# Patient Record
Sex: Female | Born: 1965 | Race: White | Hispanic: No | Marital: Married | Smoking: Current every day smoker
Health system: Southern US, Community
[De-identification: ages and names within clinical notes are randomized; demographics above are authoritative.]

## PROBLEM LIST (undated history)

## (undated) DIAGNOSIS — M069 Rheumatoid arthritis, unspecified: Secondary | ICD-10-CM

## (undated) HISTORY — PX: OOPHORECTOMY: SHX86

---

## 2019-03-31 ENCOUNTER — Emergency Department (HOSPITAL_BASED_OUTPATIENT_CLINIC_OR_DEPARTMENT_OTHER): Payer: BLUE CROSS/BLUE SHIELD

## 2019-03-31 ENCOUNTER — Other Ambulatory Visit: Payer: Self-pay

## 2019-03-31 ENCOUNTER — Emergency Department (HOSPITAL_BASED_OUTPATIENT_CLINIC_OR_DEPARTMENT_OTHER)
Admission: EM | Admit: 2019-03-31 | Discharge: 2019-03-31 | Disposition: A | Discharge status: Home / Self Care | Payer: BLUE CROSS/BLUE SHIELD | Attending: Emergency Medicine | Admitting: Emergency Medicine

## 2019-03-31 ENCOUNTER — Encounter (HOSPITAL_BASED_OUTPATIENT_CLINIC_OR_DEPARTMENT_OTHER): Payer: Self-pay | Admitting: Emergency Medicine

## 2019-03-31 DIAGNOSIS — R1013 Epigastric pain: Secondary | ICD-10-CM | POA: Diagnosis not present

## 2019-03-31 DIAGNOSIS — F17210 Nicotine dependence, cigarettes, uncomplicated: Secondary | ICD-10-CM | POA: Insufficient documentation

## 2019-03-31 DIAGNOSIS — R110 Nausea: Secondary | ICD-10-CM | POA: Diagnosis not present

## 2019-03-31 DIAGNOSIS — I10 Essential (primary) hypertension: Secondary | ICD-10-CM | POA: Diagnosis not present

## 2019-03-31 DIAGNOSIS — R079 Chest pain, unspecified: Secondary | ICD-10-CM | POA: Diagnosis present

## 2019-03-31 DIAGNOSIS — R42 Dizziness and giddiness: Secondary | ICD-10-CM | POA: Diagnosis not present

## 2019-03-31 DIAGNOSIS — Z79899 Other long term (current) drug therapy: Secondary | ICD-10-CM | POA: Diagnosis not present

## 2019-03-31 DIAGNOSIS — R072 Precordial pain: Secondary | ICD-10-CM | POA: Insufficient documentation

## 2019-03-31 DIAGNOSIS — R232 Flushing: Secondary | ICD-10-CM | POA: Diagnosis not present

## 2019-03-31 HISTORY — DX: Rheumatoid arthritis, unspecified: M06.9

## 2019-03-31 LAB — CBC WITH DIFFERENTIAL/PLATELET
Abs Immature Granulocytes: 0.06 K/uL (ref 0.00–0.07)
Basophils Absolute: 0.1 K/uL (ref 0.0–0.1)
Basophils Relative: 1 %
Eosinophils Absolute: 0.3 K/uL (ref 0.0–0.5)
Eosinophils Relative: 2 %
HCT: 41.7 % (ref 36.0–46.0)
Hemoglobin: 13.6 g/dL (ref 12.0–15.0)
Immature Granulocytes: 1 %
Lymphocytes Relative: 32 %
Lymphs Abs: 3.9 K/uL (ref 0.7–4.0)
MCH: 28.9 pg (ref 26.0–34.0)
MCHC: 32.6 g/dL (ref 30.0–36.0)
MCV: 88.5 fL (ref 80.0–100.0)
Monocytes Absolute: 1 K/uL (ref 0.1–1.0)
Monocytes Relative: 8 %
Neutro Abs: 6.9 K/uL (ref 1.7–7.7)
Neutrophils Relative %: 56 %
Platelets: 237 K/uL (ref 150–400)
RBC: 4.71 MIL/uL (ref 3.87–5.11)
RDW: 12.9 % (ref 11.5–15.5)
WBC: 12.1 K/uL — ABNORMAL HIGH (ref 4.0–10.5)
nRBC: 0 % (ref 0.0–0.2)

## 2019-03-31 LAB — BASIC METABOLIC PANEL
Anion gap: 11 (ref 5–15)
BUN: 16 mg/dL (ref 6–20)
CO2: 21 mmol/L — ABNORMAL LOW (ref 22–32)
Calcium: 9.9 mg/dL (ref 8.9–10.3)
Chloride: 109 mmol/L (ref 98–111)
Creatinine, Ser: 0.99 mg/dL (ref 0.44–1.00)
GFR calc Af Amer: >60 mL/min (ref >60)
GFR calc non Af Amer: >60 mL/min (ref >60)
Glucose, Bld: 116 mg/dL — ABNORMAL HIGH (ref 70–99)
Potassium: 4 mmol/L (ref 3.5–5.1)
Sodium: 141 mmol/L (ref 135–145)

## 2019-03-31 LAB — TROPONIN I
Troponin I: <0.03 ng/mL (ref <0.03)
Troponin I: <0.03 ng/mL (ref <0.03)

## 2019-03-31 MED ORDER — IOHEXOL 350 MG/ML SOLN
100.0000 mL | Freq: Once | INTRAVENOUS | Status: AC | PRN
  Administered 2019-03-31: 100 mL via INTRAVENOUS

## 2019-03-31 MED ORDER — ALUM & MAG HYDROXIDE-SIMETH 200-200-20 MG/5ML PO SUSP
ORAL | Status: AC
  Filled 2019-03-31: qty 30

## 2019-03-31 MED ORDER — LIDOCAINE VISCOUS HCL 2 % MT SOLN
OROMUCOSAL | Status: AC
  Filled 2019-03-31: qty 15

## 2019-03-31 MED ORDER — ALUM & MAG HYDROXIDE-SIMETH 200-200-20 MG/5ML PO SUSP
30.0000 mL | Freq: Once | ORAL | Status: AC
  Administered 2019-03-31: 30 mL via ORAL
  Filled 2019-03-31: qty 30

## 2019-03-31 MED ORDER — ALUM & MAG HYDROXIDE-SIMETH 200-200-20 MG/5ML PO SUSP
30.0000 mL | Freq: Once | ORAL | Status: AC
  Administered 2019-03-31: 30 mL via ORAL

## 2019-03-31 MED ORDER — OMEPRAZOLE 20 MG PO CPDR: 20.0000 mg | DELAYED_RELEASE_CAPSULE | Freq: Every day | ORAL | 0 refills | Status: AC

## 2019-03-31 MED ORDER — LIDOCAINE VISCOUS HCL 2 % MT SOLN
15.0000 mL | Freq: Once | OROMUCOSAL | Status: AC
  Administered 2019-03-31: 06:00:00 15 mL via ORAL
  Filled 2019-03-31: qty 15

## 2019-03-31 MED ORDER — LIDOCAINE VISCOUS HCL 2 % MT SOLN
15.0000 mL | Freq: Once | OROMUCOSAL | Status: AC
  Administered 2019-03-31: 15 mL via ORAL

## 2019-03-31 MED ORDER — KETOROLAC TROMETHAMINE 30 MG/ML IJ SOLN
15.0000 mg | Freq: Once | INTRAMUSCULAR | Status: AC
  Administered 2019-03-31: 15 mg via INTRAVENOUS
  Filled 2019-03-31: qty 1

## 2019-03-31 NOTE — ED Provider Notes (Signed)
Patient second troponin is negative.  Epigastric/atypical chest pain seems to be more GI in nature.  CTA was negative for dissection, aneurysms or other acute findings.  On CTA the gallbladder appeared unremarkable.  Patient is hemodynamically stable and prescribed omeprazole for concern for GERD or GI component to her pain.  She was given return precautions if symptoms worsen or she starts vomiting.  At that time she may need a more focused evaluation of the gallbladder.   Blanchie Dessert, MD 03/31/19 [phone]

## 2019-03-31 NOTE — ED Notes (Signed)
ED Provider at bedside. 

## 2019-03-31 NOTE — ED Notes (Signed)
Patient is resting comfortably. 

## 2019-03-31 NOTE — ED Notes (Signed)
Up to BR, gait steady 

## 2019-03-31 NOTE — ED Triage Notes (Signed)
Reports CP that started at 0300. Also reports nausea and back pain. Reports intermittent dizziness over the last week.

## 2019-03-31 NOTE — Discharge Instructions (Signed)
Take the Prilosec daily for at least 2 weeks.  Take approximately 5 to 7 days for the medication to truly kick in and noticed effective.  You can use Tums, Maalox, Tylenol as needed for the pain in the meantime.  Try to stick to the diet restrictions.  If you develop worsening pain, vomiting, fever or other concerns please follow-up with your doctor or return to the emergency room

## 2019-03-31 NOTE — ED Notes (Signed)
ED MD informed of epigastric pain

## 2019-03-31 NOTE — ED Provider Notes (Signed)
[facility] EMERGENCY DEPARTMENT Provider Note   CSN: [id number] Arrival date & time: 03/31/19  0518     History   Chief Complaint Chief Complaint  Patient presents with  . Chest Pain    HPI Stephanie Waller is a 53 y.o. female.     The history is provided by the patient.  Chest Pain Pain location:  Substernal area Pain quality: dull   Pain quality comment:  Indigestion Pain radiates to:  Upper back Pain severity:  Severe Onset quality:  Sudden Duration:  3 hours Timing:  Constant Progression:  Unchanged Chronicity:  New Context: at rest   Context: not breathing   Relieved by:  Nothing Worsened by:  Nothing Ineffective treatments:  Antacids Associated symptoms: dizziness and nausea   Associated symptoms: no abdominal pain, no cough, no fever, no headache, no lower extremity edema, no near-syncope, no palpitations and no shortness of breath   Associated symptoms comment:  Intermittent dizziness x 1 week.   Risk factors: hypertension and obesity   Patient with HTN ate a filet and some squash sauteed and then woke up with chest pain and epigastric pain.  She has had "hot flashes" off and on for the past week.    Past Medical History:  Diagnosis Date  . Rheumatoid arthritis ([place])     There are no active problems to display for this patient.   Past Surgical History:  Procedure Laterality Date  . OOPHORECTOMY       OB History   No obstetric history on file.      Home Medications    Prior to Admission medications   Medication Sig Start Date End Date Taking? Authorizing Provider  Adalimumab (HUMIRA) 20 MG/0.2ML PSKT Inject into the skin.   Yes [provider]  cloNIDine (CATAPRES) 0.1 MG tablet Take 0.1 mg by mouth 2 (two) times daily.   Yes [provider]  cyclobenzaprine (FLEXERIL) 10 MG tablet Take 10 mg by mouth 3 (three) times daily as needed for muscle spasms.   Yes [provider]  escitalopram (LEXAPRO) 10 MG tablet  Take 10 mg by mouth daily.   Yes [provider]  gabapentin (NEURONTIN) 100 MG capsule Take 100 mg by mouth 3 (three) times daily.   Yes [provider]  hydroxychloroquine (PLAQUENIL) 200 MG tablet Take by mouth daily.   Yes [provider]    Family History No family history on file.  Social History Social History   Tobacco Use  . Smoking status: Current Every Day Smoker    Types: Cigarettes  Substance Use Topics  . Alcohol use: Yes    Comment: occasional  . Drug use: Never     Allergies   Penicillins   Review of Systems Review of Systems  Constitutional: Negative for fever.  Respiratory: Negative for cough and shortness of breath.   Cardiovascular: Positive for chest pain. Negative for palpitations and near-syncope.  Gastrointestinal: Positive for nausea. Negative for abdominal pain.  Neurological: Positive for dizziness. Negative for headaches.  All other systems reviewed and are negative.    Physical Exam Updated Vital Signs BP (!) 154/82 (BP Location: Right Arm)   Pulse 74   Temp 98.1 F (36.7 C) (Oral)   Resp 20   Ht 5\' 4"  (1.626 m)   Wt 104.3 kg   SpO2 98%   BMI 39.48 kg/m   Physical Exam Vitals signs and nursing note reviewed.  Constitutional:      Appearance: Normal appearance.  HENT:     Head: Normocephalic.     Nose: Nose normal. No congestion.  Eyes:     Conjunctiva/sclera: Conjunctivae normal.     Pupils: Pupils are equal, round, and reactive to light.  Neck:     Musculoskeletal: Normal range of motion and neck supple.  Cardiovascular:     Rate and Rhythm: Normal rate and regular rhythm.     Pulses: Normal pulses.     Heart sounds: Normal heart sounds.  Pulmonary:     Effort: Pulmonary effort is normal.     Breath sounds: Normal breath sounds.  Abdominal:     General: Abdomen is flat. Bowel sounds are normal.     Tenderness: There is no abdominal tenderness. There is no guarding.  Musculoskeletal: Normal  range of motion.  Skin:    General: Skin is warm and dry.     Capillary Refill: Capillary refill takes less than 2 seconds.  Neurological:     General: No focal deficit present.     Mental Status: She is alert and oriented to person, place, and time.  Psychiatric:        Mood and Affect: Mood normal.        Behavior: Behavior normal.      ED Treatments / Results  Labs (all labs ordered are listed, but only abnormal results are displayed) Results for orders placed or performed during the hospital encounter of 03/31/19  CBC with Differential/Platelet  Result Value Ref Range   WBC 12.1 (H) 4.0 - 10.5 K/uL   RBC 4.71 3.87 - 5.11 MIL/uL   Hemoglobin 13.6 12.0 - 15.0 g/dL   HCT 16.141.7 09.636.0 - 04.546.0 %   MCV 88.5 80.0 - 100.0 fL   MCH 28.9 26.0 - 34.0 pg   MCHC 32.6 30.0 - 36.0 g/dL   RDW 40.912.9 81.111.5 - 91.415.5 %   Platelets 237 150 - 400 K/uL   nRBC 0.0 0.0 - 0.2 %   Neutrophils Relative % 56 %   Neutro Abs 6.9 1.7 - 7.7 K/uL   Lymphocytes Relative 32 %   Lymphs Abs 3.9 0.7 - 4.0 K/uL   Monocytes Relative 8 %   Monocytes Absolute 1.0 0.1 - 1.0 K/uL   Eosinophils Relative 2 %   Eosinophils Absolute 0.3 0.0 - 0.5 K/uL   Basophils Relative 1 %   Basophils Absolute 0.1 0.0 - 0.1 K/uL   Immature Granulocytes 1 %   Abs Immature Granulocytes 0.06 0.00 - 0.07 K/uL  Basic metabolic panel  Result Value Ref Range   Sodium 141 135 - 145 mmol/L   Potassium 4.0 3.5 - 5.1 mmol/L   Chloride 109 98 - 111 mmol/L   CO2 21 (L) 22 - 32 mmol/L   Glucose, Bld 116 (H) 70 - 99 mg/dL   BUN 16 6 - 20 mg/dL   Creatinine, Ser 7.820.99 0.44 - 1.00 mg/dL   Calcium 9.9 8.9 - 95.610.3 mg/dL   GFR calc non Af Amer >60 >60 mL/min   GFR calc Af Amer >60 >60 mL/min   Anion gap 11 5 - 15  Troponin I - ONCE - STAT  Result Value Ref Range   Troponin I <0.03 <0.03 ng/mL   Dg Chest Portable 1 View  Result Date: 03/31/2019 CLINICAL DATA:  Acute onset of chest pain.  Chronic cough. EXAM: PORTABLE CHEST 1 VIEW COMPARISON:   None. FINDINGS: The heart size and mediastinal contours are within normal limits. Both lungs are clear. The visualized skeletal  structures are unremarkable. IMPRESSION: Negative.  No active disease. Electronically Signed   By: Myles RosenthalJohn  Stahl M.D.   On: 03/31/2019 05:50   EKG EKG Interpretation  Date/Time:  Sunday March 31 2019 05:41:36 EDT Ventricular Rate:  65 PR Interval:    QRS Duration: 103 QT Interval:  387 QTC Calculation: 403 R Axis:   36 Text Interpretation:  Sinus rhythm Confirmed by Savreen Gebhardt ([id number]) on 03/31/2019 6:07:47 AM   Radiology Dg Chest Portable 1 View  Result Date: 03/31/2019 CLINICAL DATA:  Acute onset of chest pain.  Chronic cough. EXAM: PORTABLE CHEST 1 VIEW COMPARISON:  None. FINDINGS: The heart size and mediastinal contours are within normal limits. Both lungs are clear. The visualized skeletal structures are unremarkable. IMPRESSION: Negative.  No active disease. Electronically Signed   By: Myles RosenthalJohn  Stahl M.D.   On: 03/31/2019 05:50    Procedures Procedures (including critical care time)  Medications Ordered in ED Medications  ketorolac (TORADOL) 30 MG/ML injection 15 mg (15 mg Intravenous Given 03/31/19 0552)  alum & mag hydroxide-simeth (MAALOX/MYLANTA) 200-200-20 MG/5ML suspension 30 mL (30 mLs Oral Given 03/31/19 0551)    And  lidocaine (XYLOCAINE) 2 % viscous mouth solution 15 mL (15 mLs Oral Given 03/31/19 0551)       Final Clinical Impressions(s) / ED Diagnoses   Signed out to Dr. Venia MinksPunkett pending second troponin.     Hyman Crossan, MD 03/31/19 0730

## 2020-08-25 DIAGNOSIS — R69 Illness, unspecified: Secondary | ICD-10-CM | POA: Diagnosis not present

## 2020-08-25 DIAGNOSIS — J0100 Acute maxillary sinusitis, unspecified: Secondary | ICD-10-CM | POA: Diagnosis not present

## 2020-08-25 DIAGNOSIS — J302 Other seasonal allergic rhinitis: Secondary | ICD-10-CM | POA: Diagnosis not present

## 2020-09-21 DIAGNOSIS — B373 Candidiasis of vulva and vagina: Secondary | ICD-10-CM | POA: Diagnosis not present

## 2020-11-22 IMAGING — CT CT ANGIO CHEST-ABD-PELV FOR DISSECTION W/ AND WO/W CM
2 of 9 series · 14 of 46 positions shown, 18 images · IV contrast (APPLIED)
Comparison: None.

CLINICAL DATA: Chest pain radiating to back which began 3 hours
ago. Smoker. Rheumatoid arthritis.

EXAM:
CT ANGIOGRAPHY CHEST, ABDOMEN AND PELVIS
TECHNIQUE: Multidetector CT imaging through the chest, abdomen and pelvis was
performed using the standard protocol during bolus administration of
intravenous contrast. Multiplanar reconstructed images and MIPs were
obtained and reviewed to evaluate the vascular anatomy.
CONTRAST:  100mL OMNIPAQUE IOHEXOL 350 MG/ML SOLN

[Series 6: axial arterial · axial · arterial · 0.84mm/px · z∈[-572,-44]mm · 11 of 209 slices shown, 15 images]
[im 22/209  soft-tissue]
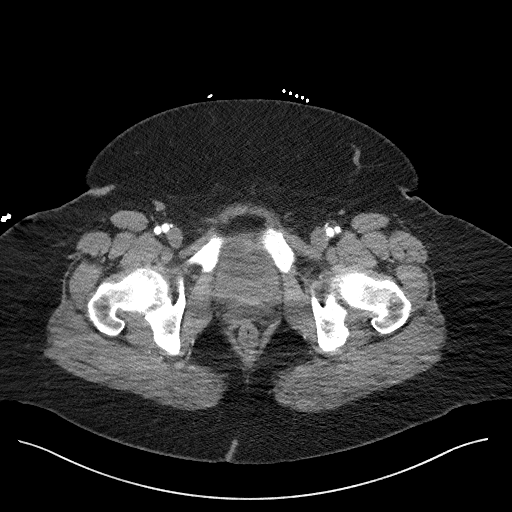
[im 22/209  bone]
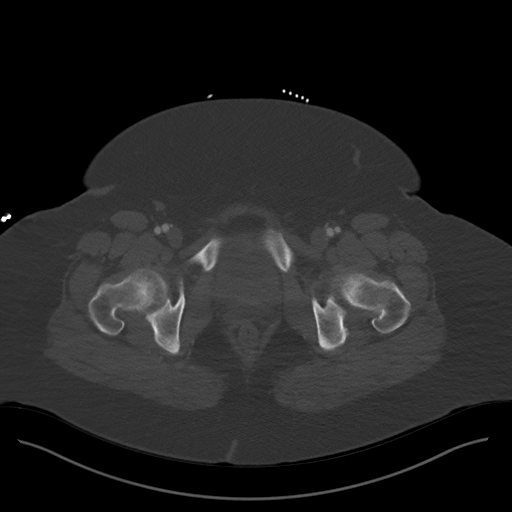
[im 44/209  soft-tissue]
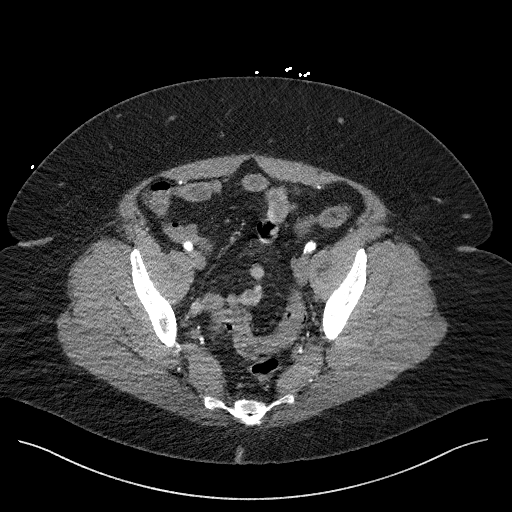
[im 66/209  soft-tissue]
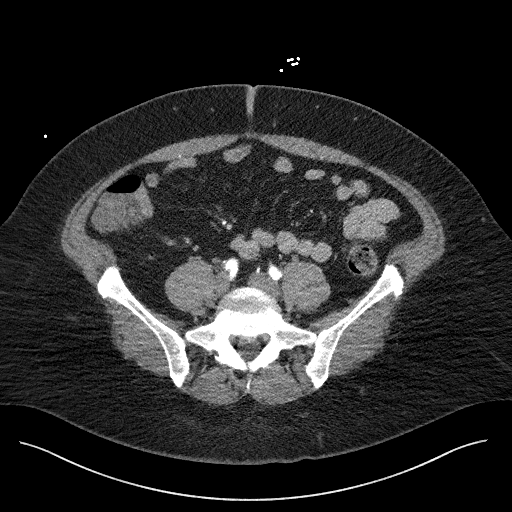
[im 88/209  soft-tissue]
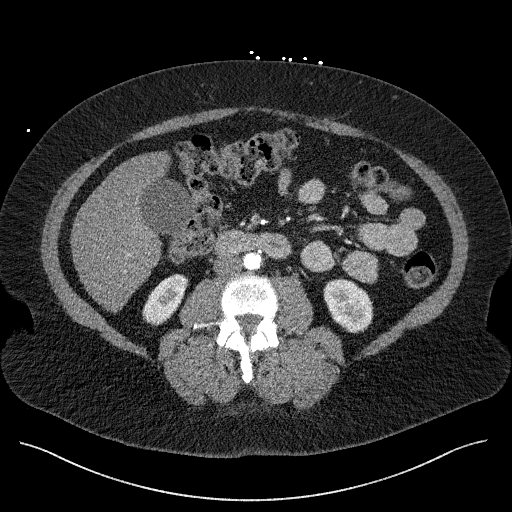
[im 110/209  soft-tissue]
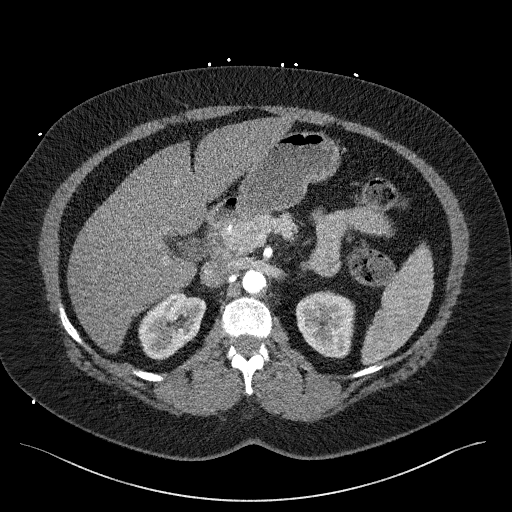
[im 132/209  soft-tissue]
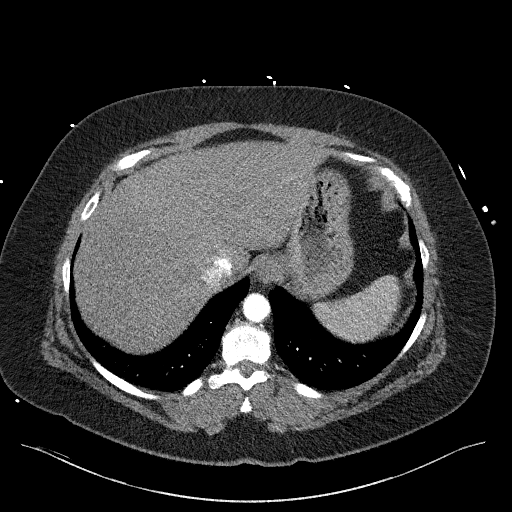
[im 154/209  soft-tissue]
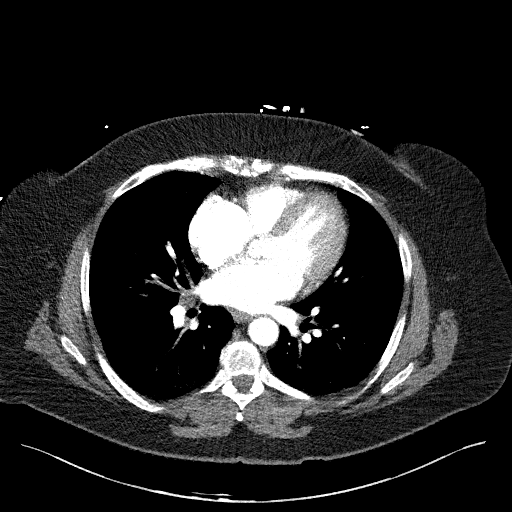
[im 165/209  lung]
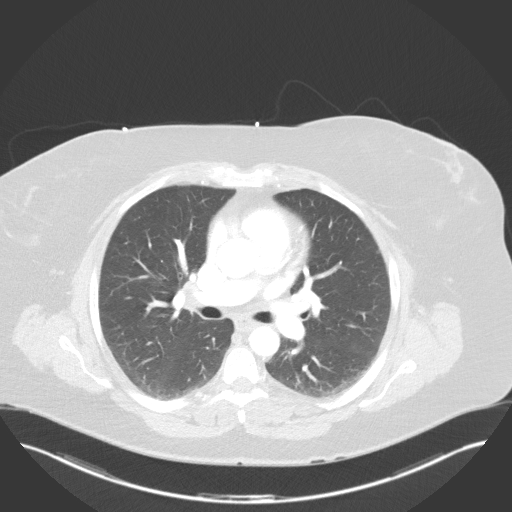
[im 176/209  soft-tissue]
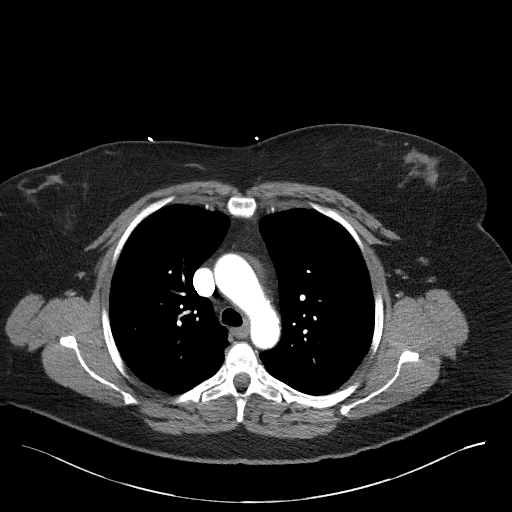
[im 176/209  lung]
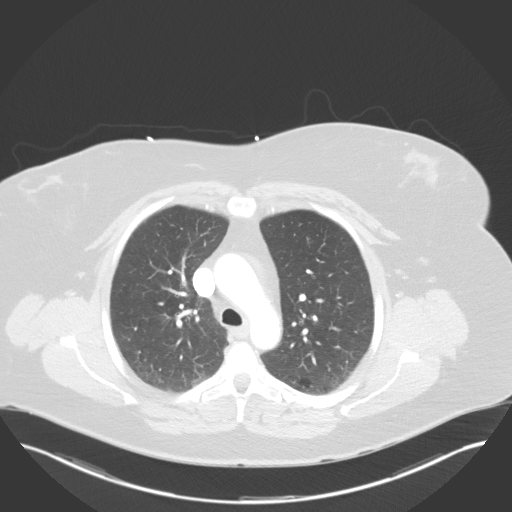
[im 187/209  lung]
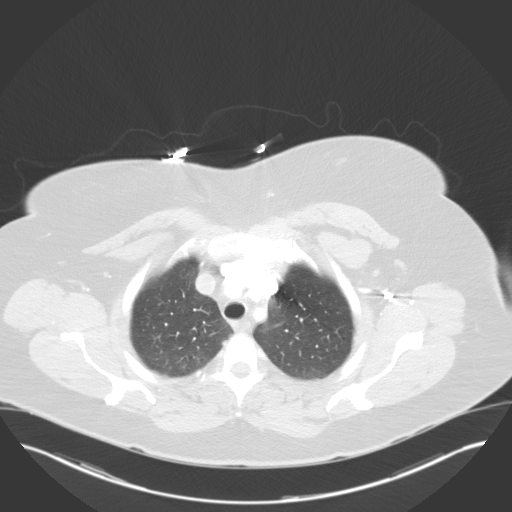
[im 198/209  soft-tissue]
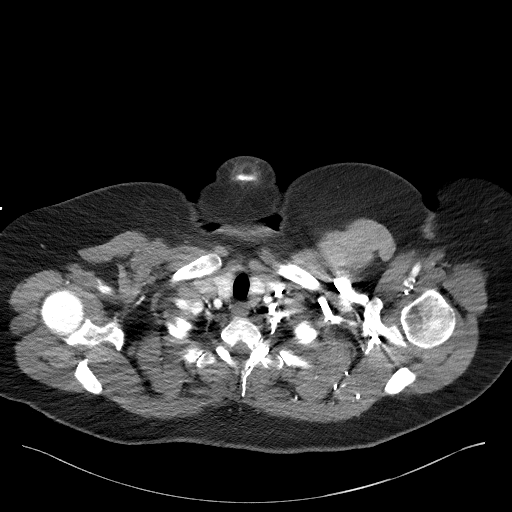
[im 198/209  lung]
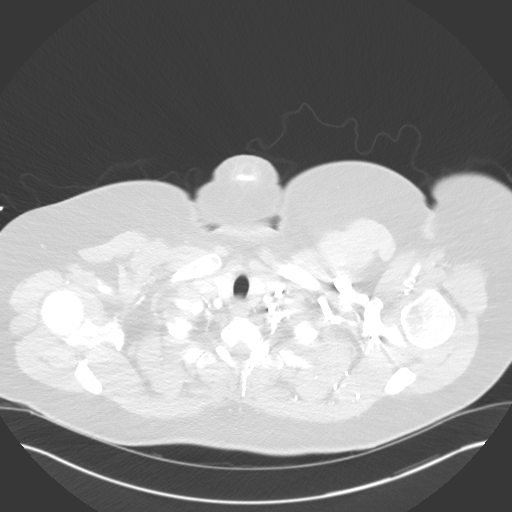
[im 198/209  bone]
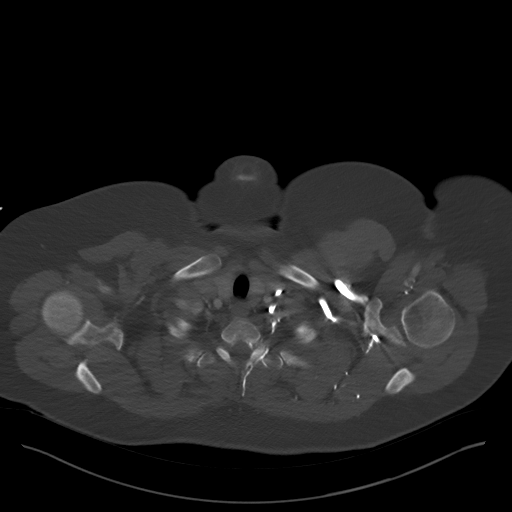

[Series 9: coronals · coronal · 0.99mm/px · 3 of 171 slices shown]
[im 43/171  soft-tissue]
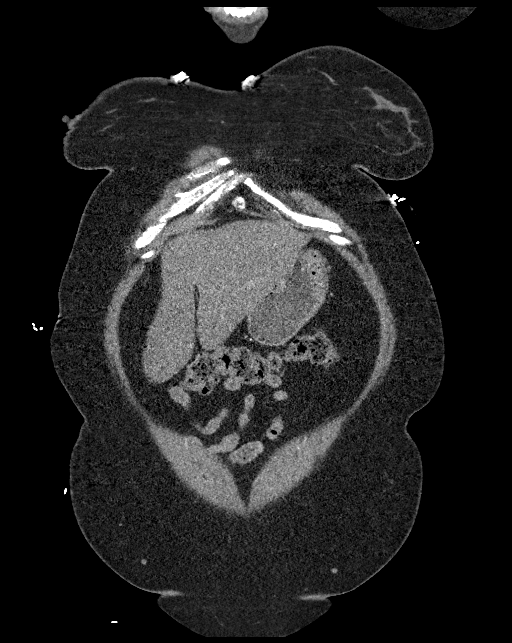
[im 86/171  soft-tissue]
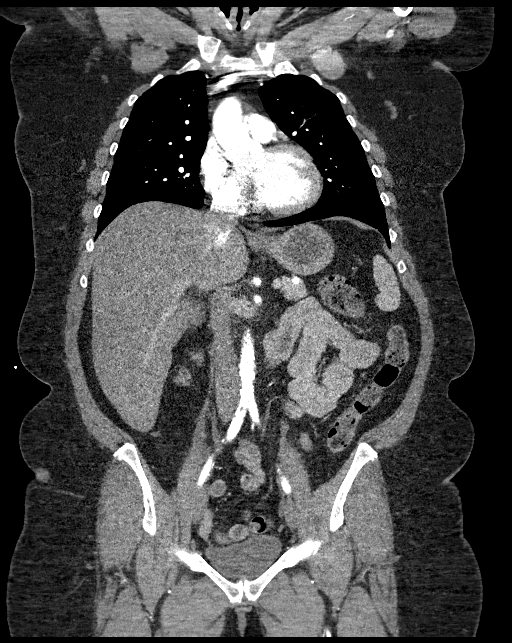
[im 128/171  soft-tissue]
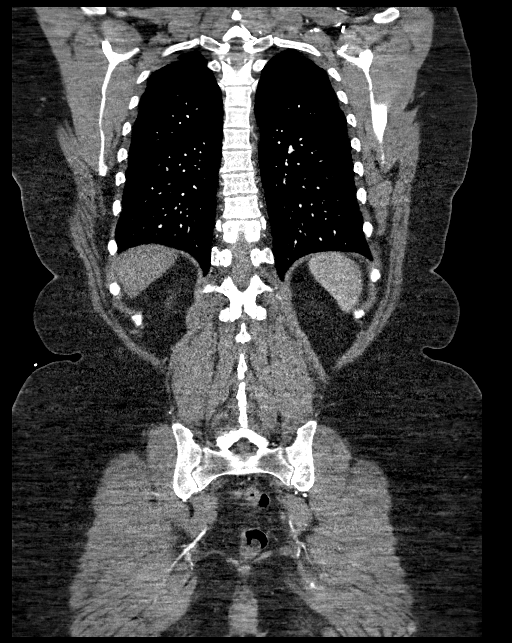

[14 of 46 positions shown; findings below may reference images not displayed]

FINDINGS: CTA CHEST FINDINGS

Cardiovascular: No evidence of thoracic aortic dissection or
aneurysm. No evidence of pulmonary embolism. Normal heart size.
Aortic and coronary artery atherosclerosis.

Mediastinum/Nodes: No masses or pathologically enlarged lymph nodes
identified.

Lungs/Pleura: No pulmonary mass, infiltrate, or effusion. No
evidence of pneumothorax.

Musculoskeletal: No suspicious bone lesions identified.

Review of the MIP images confirms the above findings.

CTA ABDOMEN PELVIS FINDINGS

Hepatobiliary: No mass identified. Gallbladder is unremarkable.

Pancreas:  No mass or inflammatory changes.

Spleen: Within normal limits in size and appearance.

Adrenals/Urinary Tract: No masses identified. No evidence of
hydronephrosis.

Stomach/Bowel: No evidence of obstruction, inflammatory process or
abnormal fluid collections.

Vascular/Lymphatic: No evidence of abdominal aortic aneurysm or
dissection. Atherosclerotic calcification is seen throughout the
abdominal aorta and bilateral iliac arteries. Celiac axis and
mesenteric arteries appear widely patent. No pathologically enlarged
lymph nodes within the abdomen or pelvis.

Reproductive:  No mass identified.

Other:  None.

Musculoskeletal:  No suspicious bone lesions identified.

Review of the MIP images confirms the above findings.
IMPRESSION: 1. No evidence of aortic dissection, aneurysm, or other acute
findings.
2. Aortic and coronary artery atherosclerosis.

## 2020-12-07 DIAGNOSIS — R69 Illness, unspecified: Secondary | ICD-10-CM | POA: Diagnosis not present

## 2020-12-07 DIAGNOSIS — Z79899 Other long term (current) drug therapy: Secondary | ICD-10-CM | POA: Diagnosis not present

## 2020-12-07 DIAGNOSIS — M0609 Rheumatoid arthritis without rheumatoid factor, multiple sites: Secondary | ICD-10-CM | POA: Diagnosis not present

## 2020-12-07 DIAGNOSIS — K2100 Gastro-esophageal reflux disease with esophagitis, without bleeding: Secondary | ICD-10-CM | POA: Diagnosis not present

## 2021-01-27 DIAGNOSIS — K2100 Gastro-esophageal reflux disease with esophagitis, without bleeding: Secondary | ICD-10-CM | POA: Diagnosis not present

## 2021-01-27 DIAGNOSIS — J069 Acute upper respiratory infection, unspecified: Secondary | ICD-10-CM | POA: Diagnosis not present

## 2021-01-27 DIAGNOSIS — H1031 Unspecified acute conjunctivitis, right eye: Secondary | ICD-10-CM | POA: Diagnosis not present

## 2021-03-18 DIAGNOSIS — R69 Illness, unspecified: Secondary | ICD-10-CM | POA: Diagnosis not present

## 2021-03-18 DIAGNOSIS — Z01419 Encounter for gynecological examination (general) (routine) without abnormal findings: Secondary | ICD-10-CM | POA: Diagnosis not present

## 2021-03-18 DIAGNOSIS — N951 Menopausal and female climacteric states: Secondary | ICD-10-CM | POA: Diagnosis not present

## 2021-06-14 DIAGNOSIS — M533 Sacrococcygeal disorders, not elsewhere classified: Secondary | ICD-10-CM | POA: Diagnosis not present

## 2021-06-14 DIAGNOSIS — M5481 Occipital neuralgia: Secondary | ICD-10-CM | POA: Diagnosis not present

## 2021-06-14 DIAGNOSIS — G43909 Migraine, unspecified, not intractable, without status migrainosus: Secondary | ICD-10-CM | POA: Diagnosis not present

## 2021-08-04 DIAGNOSIS — G8929 Other chronic pain: Secondary | ICD-10-CM | POA: Diagnosis not present

## 2021-08-04 DIAGNOSIS — M792 Neuralgia and neuritis, unspecified: Secondary | ICD-10-CM | POA: Diagnosis not present

## 2021-08-04 DIAGNOSIS — M533 Sacrococcygeal disorders, not elsewhere classified: Secondary | ICD-10-CM | POA: Diagnosis not present

## 2021-08-04 DIAGNOSIS — M0609 Rheumatoid arthritis without rheumatoid factor, multiple sites: Secondary | ICD-10-CM | POA: Diagnosis not present

## 2021-08-04 DIAGNOSIS — M545 Low back pain, unspecified: Secondary | ICD-10-CM | POA: Diagnosis not present

## 2021-09-16 DIAGNOSIS — D849 Immunodeficiency, unspecified: Secondary | ICD-10-CM | POA: Diagnosis not present

## 2021-09-16 DIAGNOSIS — U071 COVID-19: Secondary | ICD-10-CM | POA: Diagnosis not present

## 2021-09-16 DIAGNOSIS — M0609 Rheumatoid arthritis without rheumatoid factor, multiple sites: Secondary | ICD-10-CM | POA: Diagnosis not present

## 2021-09-22 DIAGNOSIS — J019 Acute sinusitis, unspecified: Secondary | ICD-10-CM | POA: Diagnosis not present

## 2021-09-22 DIAGNOSIS — R051 Acute cough: Secondary | ICD-10-CM | POA: Diagnosis not present

## 2021-09-22 DIAGNOSIS — Z8616 Personal history of COVID-19: Secondary | ICD-10-CM | POA: Diagnosis not present

## 2021-09-23 DIAGNOSIS — J9811 Atelectasis: Secondary | ICD-10-CM | POA: Diagnosis not present

## 2021-09-23 DIAGNOSIS — R059 Cough, unspecified: Secondary | ICD-10-CM | POA: Diagnosis not present

## 2021-09-23 DIAGNOSIS — R051 Acute cough: Secondary | ICD-10-CM | POA: Diagnosis not present

## 2021-10-05 DIAGNOSIS — M792 Neuralgia and neuritis, unspecified: Secondary | ICD-10-CM | POA: Diagnosis not present

## 2021-10-05 DIAGNOSIS — G8929 Other chronic pain: Secondary | ICD-10-CM | POA: Diagnosis not present

## 2021-10-05 DIAGNOSIS — M533 Sacrococcygeal disorders, not elsewhere classified: Secondary | ICD-10-CM | POA: Diagnosis not present

## 2021-10-05 DIAGNOSIS — M545 Low back pain, unspecified: Secondary | ICD-10-CM | POA: Diagnosis not present

## 2021-11-11 DIAGNOSIS — M533 Sacrococcygeal disorders, not elsewhere classified: Secondary | ICD-10-CM | POA: Diagnosis not present

## 2021-11-11 DIAGNOSIS — M545 Low back pain, unspecified: Secondary | ICD-10-CM | POA: Diagnosis not present

## 2021-11-11 DIAGNOSIS — G8929 Other chronic pain: Secondary | ICD-10-CM | POA: Diagnosis not present

## 2021-11-25 DIAGNOSIS — Z1231 Encounter for screening mammogram for malignant neoplasm of breast: Secondary | ICD-10-CM | POA: Diagnosis not present

## 2021-12-03 DIAGNOSIS — M0609 Rheumatoid arthritis without rheumatoid factor, multiple sites: Secondary | ICD-10-CM | POA: Diagnosis not present

## 2021-12-03 DIAGNOSIS — M5431 Sciatica, right side: Secondary | ICD-10-CM | POA: Diagnosis not present

## 2021-12-03 DIAGNOSIS — M2550 Pain in unspecified joint: Secondary | ICD-10-CM | POA: Diagnosis not present

## 2021-12-03 DIAGNOSIS — Z79899 Other long term (current) drug therapy: Secondary | ICD-10-CM | POA: Diagnosis not present

## 2021-12-07 DIAGNOSIS — M47816 Spondylosis without myelopathy or radiculopathy, lumbar region: Secondary | ICD-10-CM | POA: Diagnosis not present

## 2021-12-07 DIAGNOSIS — M545 Low back pain, unspecified: Secondary | ICD-10-CM | POA: Diagnosis not present

## 2021-12-14 DIAGNOSIS — G8929 Other chronic pain: Secondary | ICD-10-CM | POA: Diagnosis not present

## 2021-12-14 DIAGNOSIS — M5431 Sciatica, right side: Secondary | ICD-10-CM | POA: Diagnosis not present

## 2021-12-14 DIAGNOSIS — M7061 Trochanteric bursitis, right hip: Secondary | ICD-10-CM | POA: Diagnosis not present

## 2021-12-14 DIAGNOSIS — N3942 Incontinence without sensory awareness: Secondary | ICD-10-CM | POA: Diagnosis not present

## 2021-12-14 DIAGNOSIS — M5136 Other intervertebral disc degeneration, lumbar region: Secondary | ICD-10-CM | POA: Diagnosis not present

## 2021-12-14 DIAGNOSIS — M545 Low back pain, unspecified: Secondary | ICD-10-CM | POA: Diagnosis not present

## 2021-12-30 DIAGNOSIS — G8929 Other chronic pain: Secondary | ICD-10-CM | POA: Diagnosis not present

## 2021-12-30 DIAGNOSIS — M545 Low back pain, unspecified: Secondary | ICD-10-CM | POA: Diagnosis not present

## 2021-12-30 DIAGNOSIS — M5416 Radiculopathy, lumbar region: Secondary | ICD-10-CM | POA: Diagnosis not present

## 2021-12-30 DIAGNOSIS — N3942 Incontinence without sensory awareness: Secondary | ICD-10-CM | POA: Diagnosis not present

## 2021-12-30 DIAGNOSIS — M5136 Other intervertebral disc degeneration, lumbar region: Secondary | ICD-10-CM | POA: Diagnosis not present

## 2022-01-24 DIAGNOSIS — N3090 Cystitis, unspecified without hematuria: Secondary | ICD-10-CM | POA: Diagnosis not present

## 2022-01-24 DIAGNOSIS — N3000 Acute cystitis without hematuria: Secondary | ICD-10-CM | POA: Diagnosis not present

## 2022-01-24 DIAGNOSIS — Z90721 Acquired absence of ovaries, unilateral: Secondary | ICD-10-CM | POA: Diagnosis not present

## 2022-01-24 DIAGNOSIS — B3731 Acute candidiasis of vulva and vagina: Secondary | ICD-10-CM | POA: Diagnosis not present

## 2022-01-24 DIAGNOSIS — Z9079 Acquired absence of other genital organ(s): Secondary | ICD-10-CM | POA: Diagnosis not present

## 2022-01-24 DIAGNOSIS — N9412 Deep dyspareunia: Secondary | ICD-10-CM | POA: Diagnosis not present

## 2022-03-17 DIAGNOSIS — L821 Other seborrheic keratosis: Secondary | ICD-10-CM | POA: Diagnosis not present

## 2022-03-17 DIAGNOSIS — D225 Melanocytic nevi of trunk: Secondary | ICD-10-CM | POA: Diagnosis not present

## 2022-03-17 DIAGNOSIS — L820 Inflamed seborrheic keratosis: Secondary | ICD-10-CM | POA: Diagnosis not present

## 2022-03-17 DIAGNOSIS — L578 Other skin changes due to chronic exposure to nonionizing radiation: Secondary | ICD-10-CM | POA: Diagnosis not present

## 2022-03-17 DIAGNOSIS — Z85828 Personal history of other malignant neoplasm of skin: Secondary | ICD-10-CM | POA: Diagnosis not present

## 2022-03-17 DIAGNOSIS — L918 Other hypertrophic disorders of the skin: Secondary | ICD-10-CM | POA: Diagnosis not present

## 2022-03-17 DIAGNOSIS — L570 Actinic keratosis: Secondary | ICD-10-CM | POA: Diagnosis not present

## 2022-03-17 DIAGNOSIS — C44511 Basal cell carcinoma of skin of breast: Secondary | ICD-10-CM | POA: Diagnosis not present

## 2022-03-17 DIAGNOSIS — D485 Neoplasm of uncertain behavior of skin: Secondary | ICD-10-CM | POA: Diagnosis not present

## 2022-04-22 DIAGNOSIS — B379 Candidiasis, unspecified: Secondary | ICD-10-CM | POA: Diagnosis not present

## 2022-04-22 DIAGNOSIS — J019 Acute sinusitis, unspecified: Secondary | ICD-10-CM | POA: Diagnosis not present

## 2022-04-22 DIAGNOSIS — T3695XA Adverse effect of unspecified systemic antibiotic, initial encounter: Secondary | ICD-10-CM | POA: Diagnosis not present

## 2022-07-11 DIAGNOSIS — J019 Acute sinusitis, unspecified: Secondary | ICD-10-CM | POA: Diagnosis not present

## 2022-07-22 DIAGNOSIS — Z79899 Other long term (current) drug therapy: Secondary | ICD-10-CM | POA: Diagnosis not present

## 2022-07-22 DIAGNOSIS — M0609 Rheumatoid arthritis without rheumatoid factor, multiple sites: Secondary | ICD-10-CM | POA: Diagnosis not present

## 2022-08-15 DIAGNOSIS — R197 Diarrhea, unspecified: Secondary | ICD-10-CM | POA: Diagnosis not present

## 2022-08-15 DIAGNOSIS — J069 Acute upper respiratory infection, unspecified: Secondary | ICD-10-CM | POA: Diagnosis not present

## 2022-08-15 DIAGNOSIS — R051 Acute cough: Secondary | ICD-10-CM | POA: Diagnosis not present

## 2022-09-05 DIAGNOSIS — R059 Cough, unspecified: Secondary | ICD-10-CM | POA: Diagnosis not present

## 2022-09-05 DIAGNOSIS — R051 Acute cough: Secondary | ICD-10-CM | POA: Diagnosis not present

## 2022-09-22 DIAGNOSIS — C4441 Basal cell carcinoma of skin of scalp and neck: Secondary | ICD-10-CM | POA: Diagnosis not present

## 2022-09-22 DIAGNOSIS — D225 Melanocytic nevi of trunk: Secondary | ICD-10-CM | POA: Diagnosis not present

## 2022-09-22 DIAGNOSIS — X32XXXS Exposure to sunlight, sequela: Secondary | ICD-10-CM | POA: Diagnosis not present

## 2022-09-22 DIAGNOSIS — D2371 Other benign neoplasm of skin of right lower limb, including hip: Secondary | ICD-10-CM | POA: Diagnosis not present

## 2022-09-22 DIAGNOSIS — L814 Other melanin hyperpigmentation: Secondary | ICD-10-CM | POA: Diagnosis not present

## 2022-09-22 DIAGNOSIS — Z85828 Personal history of other malignant neoplasm of skin: Secondary | ICD-10-CM | POA: Diagnosis not present

## 2022-09-22 DIAGNOSIS — D485 Neoplasm of uncertain behavior of skin: Secondary | ICD-10-CM | POA: Diagnosis not present

## 2022-09-22 DIAGNOSIS — L918 Other hypertrophic disorders of the skin: Secondary | ICD-10-CM | POA: Diagnosis not present

## 2022-09-22 DIAGNOSIS — L578 Other skin changes due to chronic exposure to nonionizing radiation: Secondary | ICD-10-CM | POA: Diagnosis not present

## 2022-11-15 DIAGNOSIS — C4441 Basal cell carcinoma of skin of scalp and neck: Secondary | ICD-10-CM | POA: Diagnosis not present

## 2022-11-15 DIAGNOSIS — L820 Inflamed seborrheic keratosis: Secondary | ICD-10-CM | POA: Diagnosis not present

## 2022-12-13 DIAGNOSIS — L918 Other hypertrophic disorders of the skin: Secondary | ICD-10-CM | POA: Diagnosis not present

## 2022-12-13 DIAGNOSIS — L304 Erythema intertrigo: Secondary | ICD-10-CM | POA: Diagnosis not present

## 2022-12-13 DIAGNOSIS — L820 Inflamed seborrheic keratosis: Secondary | ICD-10-CM | POA: Diagnosis not present

## 2023-01-03 DIAGNOSIS — M7061 Trochanteric bursitis, right hip: Secondary | ICD-10-CM | POA: Diagnosis not present

## 2023-02-28 DIAGNOSIS — F411 Generalized anxiety disorder: Secondary | ICD-10-CM | POA: Diagnosis not present

## 2023-02-28 DIAGNOSIS — B379 Candidiasis, unspecified: Secondary | ICD-10-CM | POA: Diagnosis not present

## 2023-02-28 DIAGNOSIS — N952 Postmenopausal atrophic vaginitis: Secondary | ICD-10-CM | POA: Diagnosis not present

## 2023-02-28 DIAGNOSIS — B3732 Chronic candidiasis of vulva and vagina: Secondary | ICD-10-CM | POA: Diagnosis not present

## 2023-02-28 DIAGNOSIS — N951 Menopausal and female climacteric states: Secondary | ICD-10-CM | POA: Diagnosis not present

## 2023-02-28 DIAGNOSIS — Z01411 Encounter for gynecological examination (general) (routine) with abnormal findings: Secondary | ICD-10-CM | POA: Diagnosis not present

## 2023-02-28 DIAGNOSIS — Z1151 Encounter for screening for human papillomavirus (HPV): Secondary | ICD-10-CM | POA: Diagnosis not present

## 2023-03-14 DIAGNOSIS — D485 Neoplasm of uncertain behavior of skin: Secondary | ICD-10-CM | POA: Diagnosis not present

## 2023-03-14 DIAGNOSIS — C44311 Basal cell carcinoma of skin of nose: Secondary | ICD-10-CM | POA: Diagnosis not present

## 2023-03-30 DIAGNOSIS — M0609 Rheumatoid arthritis without rheumatoid factor, multiple sites: Secondary | ICD-10-CM | POA: Diagnosis not present

## 2023-03-30 DIAGNOSIS — Z79899 Other long term (current) drug therapy: Secondary | ICD-10-CM | POA: Diagnosis not present

## 2023-04-25 DIAGNOSIS — B9689 Other specified bacterial agents as the cause of diseases classified elsewhere: Secondary | ICD-10-CM | POA: Diagnosis not present

## 2023-04-25 DIAGNOSIS — R350 Frequency of micturition: Secondary | ICD-10-CM | POA: Diagnosis not present

## 2023-04-25 DIAGNOSIS — N760 Acute vaginitis: Secondary | ICD-10-CM | POA: Diagnosis not present

## 2023-05-25 DIAGNOSIS — C44311 Basal cell carcinoma of skin of nose: Secondary | ICD-10-CM | POA: Diagnosis not present

## 2023-06-09 DIAGNOSIS — M069 Rheumatoid arthritis, unspecified: Secondary | ICD-10-CM | POA: Diagnosis not present

## 2023-06-09 DIAGNOSIS — Z79899 Other long term (current) drug therapy: Secondary | ICD-10-CM | POA: Diagnosis not present

## 2023-07-20 DIAGNOSIS — R051 Acute cough: Secondary | ICD-10-CM | POA: Diagnosis not present

## 2023-07-20 DIAGNOSIS — R079 Chest pain, unspecified: Secondary | ICD-10-CM | POA: Diagnosis not present

## 2023-07-20 DIAGNOSIS — R059 Cough, unspecified: Secondary | ICD-10-CM | POA: Diagnosis not present

## 2023-07-20 DIAGNOSIS — J0100 Acute maxillary sinusitis, unspecified: Secondary | ICD-10-CM | POA: Diagnosis not present

## 2023-07-20 DIAGNOSIS — R0602 Shortness of breath: Secondary | ICD-10-CM | POA: Diagnosis not present
